# Patient Record
Sex: Female | Born: 1965 | Race: White | Hispanic: No | Marital: Married | Smoking: Never smoker
Health system: Southern US, Community
[De-identification: ages and names within clinical notes are randomized; demographics above are authoritative.]

## PROBLEM LIST (undated history)

## (undated) DIAGNOSIS — B029 Zoster without complications: Secondary | ICD-10-CM

## (undated) DIAGNOSIS — S12200A Unspecified displaced fracture of third cervical vertebra, initial encounter for closed fracture: Secondary | ICD-10-CM

## (undated) DIAGNOSIS — G35 Multiple sclerosis: Secondary | ICD-10-CM

## (undated) DIAGNOSIS — E7409 Other glycogen storage disease: Secondary | ICD-10-CM

## (undated) DIAGNOSIS — C801 Malignant (primary) neoplasm, unspecified: Secondary | ICD-10-CM

## (undated) HISTORY — PX: NOSE SURGERY: SHX723

## (undated) HISTORY — PX: BREAST SURGERY: SHX581

---

## 2018-11-28 ENCOUNTER — Other Ambulatory Visit: Payer: Self-pay

## 2018-11-28 DIAGNOSIS — Z20822 Contact with and (suspected) exposure to covid-19: Secondary | ICD-10-CM

## 2018-12-02 LAB — NOVEL CORONAVIRUS, NAA: SARS-CoV-2, NAA: NOT DETECTED

## 2018-12-02 LAB — SPECIMEN STATUS REPORT

## 2019-01-19 ENCOUNTER — Emergency Department (HOSPITAL_COMMUNITY)
Admission: EM | Admit: 2019-01-19 | Discharge: 2019-01-19 | Disposition: A | Discharge status: Home / Self Care | Payer: Medicaid Other | Attending: Emergency Medicine | Admitting: Emergency Medicine

## 2019-01-19 ENCOUNTER — Emergency Department (HOSPITAL_COMMUNITY): Payer: Medicaid Other

## 2019-01-19 ENCOUNTER — Encounter (HOSPITAL_COMMUNITY): Payer: Self-pay | Admitting: Emergency Medicine

## 2019-01-19 DIAGNOSIS — Z79899 Other long term (current) drug therapy: Secondary | ICD-10-CM | POA: Insufficient documentation

## 2019-01-19 DIAGNOSIS — R531 Weakness: Secondary | ICD-10-CM | POA: Diagnosis present

## 2019-01-19 DIAGNOSIS — Z859 Personal history of malignant neoplasm, unspecified: Secondary | ICD-10-CM | POA: Diagnosis not present

## 2019-01-19 DIAGNOSIS — G35 Multiple sclerosis: Secondary | ICD-10-CM | POA: Diagnosis not present

## 2019-01-19 HISTORY — DX: Other glycogen storage disease: E74.09

## 2019-01-19 HISTORY — DX: Unspecified displaced fracture of third cervical vertebra, initial encounter for closed fracture: S12.200A

## 2019-01-19 HISTORY — DX: Malignant (primary) neoplasm, unspecified: C80.1

## 2019-01-19 HISTORY — DX: Multiple sclerosis: G35

## 2019-01-19 HISTORY — DX: Zoster without complications: B02.9

## 2019-01-19 LAB — URINALYSIS, ROUTINE W REFLEX MICROSCOPIC
Bacteria, UA: NONE SEEN
Bilirubin Urine: NEGATIVE
Glucose, UA: NEGATIVE mg/dL
Ketones, ur: 5 mg/dL — AB
Leukocytes,Ua: NEGATIVE
Nitrite: NEGATIVE
Protein, ur: NEGATIVE mg/dL
Specific Gravity, Urine: 1.014 (ref 1.005–1.030)
pH: 6 (ref 5.0–8.0)

## 2019-01-19 LAB — CBC
HCT: 39.3 % (ref 36.0–46.0)
Hemoglobin: 13.4 g/dL (ref 12.0–15.0)
MCH: 32 pg (ref 26.0–34.0)
MCHC: 34.1 g/dL (ref 30.0–36.0)
MCV: 93.8 fL (ref 80.0–100.0)
Platelets: 215 10*3/uL (ref 150–400)
RBC: 4.19 MIL/uL (ref 3.87–5.11)
RDW: 14.9 % (ref 11.5–15.5)
WBC: 9.2 10*3/uL (ref 4.0–10.5)
nRBC: 0 % (ref 0.0–0.2)

## 2019-01-19 LAB — BASIC METABOLIC PANEL
Anion gap: 11 (ref 5–15)
BUN: 8 mg/dL (ref 6–20)
CO2: 23 mmol/L (ref 22–32)
Calcium: 10.3 mg/dL (ref 8.9–10.3)
Chloride: 105 mmol/L (ref 98–111)
Creatinine, Ser: 0.7 mg/dL (ref 0.44–1.00)
GFR calc Af Amer: >60 mL/min (ref >60)
GFR calc non Af Amer: >60 mL/min (ref >60)
Glucose, Bld: 101 mg/dL — ABNORMAL HIGH (ref 70–99)
Potassium: 3.8 mmol/L (ref 3.5–5.1)
Sodium: 139 mmol/L (ref 135–145)

## 2019-01-19 MED ORDER — SODIUM CHLORIDE 0.9 % IV BOLUS
1000.0000 mL | Freq: Once | INTRAVENOUS | Status: AC
  Administered 2019-01-19: 1000 mL via INTRAVENOUS

## 2019-01-19 MED ORDER — GADOBUTROL 1 MMOL/ML IV SOLN
7.0000 mL | Freq: Once | INTRAVENOUS | Status: AC | PRN
  Administered 2019-01-19: 16:00:00 7 mL via INTRAVENOUS

## 2019-01-19 MED ORDER — SODIUM CHLORIDE 0.9 % IV SOLN: 1000.0000 mg | Freq: Once | INTRAVENOUS | Status: DC

## 2019-01-19 MED ORDER — SODIUM CHLORIDE 0.9 % IV BOLUS
1000.0000 mL | Freq: Once | INTRAVENOUS | Status: AC
  Administered 2019-01-19: 14:00:00 1000 mL via INTRAVENOUS

## 2019-01-19 NOTE — ED Triage Notes (Signed)
Patient presents ambulatory stating she thinks she is having a MS exacerbation. States she woke up after having an episode of incontinence of her bowel and bladder. Patient also feeling weak.

## 2019-01-19 NOTE — ED Provider Notes (Signed)
[facility] EMERGENCY DEPARTMENT Provider Note   CSN: [id number] Arrival date & time: 01/19/19  1102     History   Chief Complaint Chief Complaint  Patient presents with  . Weakness    HPI Donna Clark is a 53 y.o. female.     Pt presents to the ED today with a possible MS flare.  The pt has a hx of MS and woke up this morning with bowel and bladder incontinence.  She also feels generally weak.  She does not take anything currently for her MS.     Past Medical History:  Diagnosis Date  . 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 C3 cervical fracture ([place])   . Cancer ([place])   . MS (multiple sclerosis) ([place])   . Shingles     There are no active problems to display for this patient.   Past Surgical History:  Procedure Laterality Date  . BREAST SURGERY     bilateral mastectomy   . NOSE SURGERY       OB History   No obstetric history on file.      Home Medications    Prior to Admission medications   Medication Sig Start Date End Date Taking? Authorizing Provider  pregabalin (LYRICA) 300 MG capsule Take 300 mg by mouth 3 (three) times daily.   Yes [provider]    Family History No family history on file.  Social History Social History   Tobacco Use  . Smoking status: Never Smoker  . Smokeless tobacco: Never Used  Substance Use Topics  . Alcohol use: Not Currently  . Drug use: Yes    Types: Marijuana     Allergies   Linzess [linaclotide] and Morphine and related   Review of Systems Review of Systems  Neurological:       Bowel and bladder incontinence.  All other systems reviewed and are negative.    Physical Exam Updated Vital Signs BP 126/76 (BP Location: Left Arm)   Pulse 68   Temp 99.2 F (37.3 C) (Oral)   Resp 16   SpO2 99%   Physical Exam Vitals signs and nursing note reviewed.  Constitutional:      Appearance: Normal appearance.  HENT:     Head: Normocephalic and  atraumatic.     Right Ear: External ear normal.     Left Ear: External ear normal.     Nose: Nose normal.     Mouth/Throat:     Mouth: Mucous membranes are moist.     Pharynx: Oropharynx is clear.  Eyes:     Extraocular Movements: Extraocular movements intact.     Conjunctiva/sclera: Conjunctivae normal.     Pupils: Pupils are equal, round, and reactive to light.  Neck:     Musculoskeletal: Normal range of motion and neck supple.  Cardiovascular:     Rate and Rhythm: Normal rate and regular rhythm.     Pulses: Normal pulses.     Heart sounds: Normal heart sounds.  Pulmonary:     Effort: Pulmonary effort is normal.     Breath sounds: Normal breath sounds.  Abdominal:     General: Abdomen is flat. Bowel sounds are normal.     Palpations: Abdomen is soft.  Musculoskeletal: Normal range of motion.  Skin:    General: Skin is warm.     Capillary Refill: Capillary refill takes less than 2 seconds.  Neurological:     General: No focal deficit present.     Mental  Status: She is alert and oriented to person, place, and time.  Psychiatric:        Mood and Affect: Mood normal.        Behavior: Behavior normal.        Thought Content: Thought content normal.        Judgment: Judgment normal.      ED Treatments / Results  Labs (all labs ordered are listed, but only abnormal results are displayed) Labs Reviewed  BASIC METABOLIC PANEL - Abnormal; Notable for the following components:      Result Value   Glucose, Bld 101 (*)    All other components within normal limits  URINALYSIS, ROUTINE W REFLEX MICROSCOPIC - Abnormal; Notable for the following components:   Hgb urine dipstick SMALL (*)    Ketones, ur 5 (*)    All other components within normal limits  CBC    EKG None  Radiology No results found.  Procedures Procedures (including critical care time)  Medications Ordered in ED Medications  sodium chloride 0.9 % bolus 1,000 mL (has no administration in time range)   sodium chloride 0.9 % bolus 1,000 mL (1,000 mLs Intravenous New Bag/Given 01/19/19 1414)     Initial Impression / Assessment and Plan / ED Course  I have reviewed the triage vital signs and the nursing notes.  Pertinent labs & imaging results that were available during my care of the patient were reviewed by me and considered in my medical decision making (see chart for details).       Pt does not have a local neurologist.  She was d/w Dr. Rory Percy who recommended MRI brain and cervical spine.  If there are lesions, then high dose solumedrol.  If not, she can go home.  MRI is still pending at shift change.  Pt signed out to Dr. Wilson Singer.  Final Clinical Impressions(s) / ED Diagnoses   Final diagnoses:  MS (multiple sclerosis) [facility])    ED Discharge Orders    None       Isla Pence, MD 01/19/19 1516

## 2019-01-19 NOTE — ED Notes (Signed)
Pt ambulatory to the bathroom 

## 2019-01-19 NOTE — ED Provider Notes (Signed)
Assumed care in sign out. Imaging as below. Hx of MS but imaging w/o evidence of acute demyelination. Some cervical spine disc protrusion but shouldn't account for her symptoms. Outpt follow-up.   Mr Jeri Cos And Wo Contrast  Result Date: 01/19/2019 CLINICAL DATA:  53 year old female with multiple sclerosis, increased weakness, incontinence. EXAM: MRI HEAD WITHOUT AND WITH CONTRAST TECHNIQUE: Multiplanar, multiecho pulse sequences of the brain and surrounding structures were obtained without and with intravenous contrast. CONTRAST:  102mL GADAVIST GADOBUTROL 1 MMOL/ML IV SOLN COMPARISON:  None. FINDINGS: Brain: Cerebral volume is within normal limits for age. No restricted diffusion to suggest acute infarction. No midline shift, mass effect, evidence of mass lesion, ventriculomegaly, extra-axial collection or acute intracranial hemorrhage. Cervicomedullary junction and pituitary are within normal limits. A few small scattered T2 and FLAIR hyperintense foci are noted in the cerebral white matter, including at the left frontal operculum series 9, image 16. There are mild signal changes in the periventricular white matter abutting the corpus callosum. No temporal lobe involvement. No cortical involvement or encephalomalacia. Deep gray matter nuclei, brainstem and cerebellum are spared. No chronic blood products. No abnormal enhancement identified. No dural thickening. Vascular: Major intracranial vascular flow voids are preserved. The major dural venous sinuses are enhancing and appear to be patent. Skull and upper cervical spine: Cervical spine reported separately today. Visualized bone marrow signal is within normal limits. Sinuses/Orbits: Orbits appear normal.  Paranasal sinuses are clear. Other: Mastoids are clear. Visible internal auditory structures appear normal. Scalp and face soft tissues appear negative. IMPRESSION: 1. Mild for age and nonspecific cerebral white matter signal changes. These could be related  to chronic demyelinating disease. 2. No evidence of acute demyelination or acute intracranial abnormality. Electronically Signed   By: Genevie Ann M.D.   On: 01/19/2019 16:39   Mr Cervical Spine W Or Wo Contrast  Result Date: 01/19/2019 CLINICAL DATA:  53 year old female with multiple sclerosis, increased weakness, incontinence. EXAM: MRI CERVICAL SPINE WITHOUT AND WITH CONTRAST TECHNIQUE: Multiplanar and multiecho pulse sequences of the cervical spine, to include the craniocervical junction and cervicothoracic junction, were obtained without and with intravenous contrast. CONTRAST:  24mL GADAVIST GADOBUTROL 1 MMOL/ML IV SOLN in conjunction with contrast enhanced imaging of the brain reported separately. COMPARISON:  Brain MRI today reported separately. FINDINGS: Alignment: Straightening of cervical lordosis. No spondylolisthesis. Vertebrae: Mild hardware susceptibility artifact at C5-C6 related to prior ACDF. No marrow edema or evidence of acute osseous abnormality. Background bone marrow signal appears normal. Cord: No definite abnormal cervical spinal cord signal. Visible upper thoracic spinal cord appears within normal limits. No abnormal intradural enhancement. No dural thickening. Posterior Fossa, vertebral arteries, paraspinal tissues: Cervicomedullary junction is within normal limits. Brain findings are reported separately. Preserved major vascular flow voids in the neck. Negative visible neck soft tissues and lung apices. Disc levels: C2-C3:  Mild facet hypertrophy. C3-C4: Mild rightward disc bulge and endplate spurring. Neural foramina are most affected. Mild facet hypertrophy. Mild right greater than left C4 foraminal stenosis. C4-C5: Small to moderate size central to left paracentral disc protrusion (series 12, image 21 and series 9 image 8) superimposed on mild disc bulging. Mild spinal stenosis and spinal cord mass effect. No significant foraminal stenosis. C5-C6: Prior ACDF. Residual endplate spurring  with mild to moderate bilateral C6 foraminal stenosis. C6-C7: Circumferential disc bulge and endplate spurring. Mild broad-based posterior component without spinal stenosis. Mild to moderate left and borderline to mild right C7 foraminal stenosis. C7-T1: Mild disc bulge and endplate  spurring most affecting the neural foramina. Mild facet hypertrophy greater on the left. Mild to moderate left C8 foraminal stenosis. No upper thoracic spinal stenosis. IMPRESSION: 1. 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 No associated cord signal abnormality. 2. No convincing demyelinating disease in the visible spinal cord. 3. Other cervical spine degeneration without spinal stenosis. Up to moderate neural foraminal stenosis at the bilateral C6, left C7 and left C8 nerve levels. Electronically Signed   By: Genevie Ann M.D.   On: 01/19/2019 16:44      Virgel Manifold, MD 01/19/19 1706

## 2019-01-19 NOTE — Discharge Instructions (Addendum)
Your MRIs did not show evidence of acute demyelination. Your symptoms do not appear to be from a MS exacerbation. There is evidence of prior neck surgery and some intervertebral disc protrusion but this wouldn't account for your symptoms. If your symptoms persist, then you need to follow-up with neurology.

## 2019-01-19 NOTE — ED Notes (Signed)
Patient verbalizes understanding of discharge instructions. Opportunity for questioning and answers were provided. Armband removed by staff, pt discharged from ED ambulatory to home.  

## 2020-08-08 IMAGING — MR MR HEAD WO/W CM
15 of 17 series · 40 of 48 positions shown · IV contrast (gadavist)
Comparison: None.

CLINICAL DATA: 52-year-old female with multiple sclerosis,
increased weakness, incontinence.

EXAM:
MRI HEAD WITHOUT AND WITH CONTRAST
TECHNIQUE: Multiplanar, multiecho pulse sequences of the brain and surrounding
structures were obtained without and with intravenous contrast.
CONTRAST:  7mL GADAVIST GADOBUTROL 1 MMOL/ML IV SOLN

[Series 5: DWI · axial · 3.0mm · 0.88mm/px · z∈[-65,+88]mm · 6 of 104 slices shown (1 of 4)]
[im 1/104]
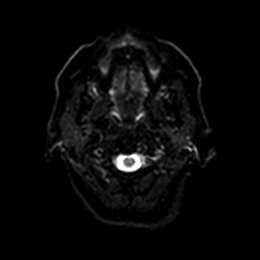
[im 21/104]
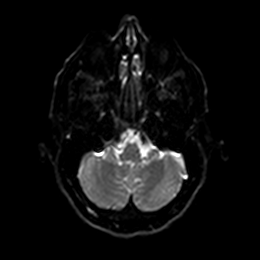
[im 42/104]
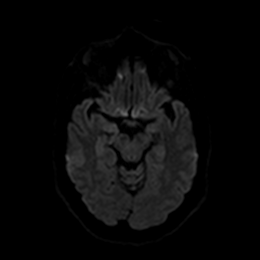
[im 62/104]
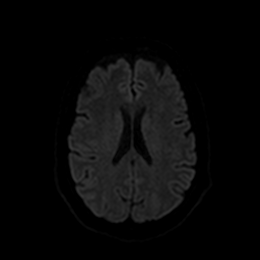
[im 83/104]
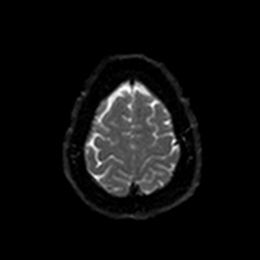
[im 104/104]
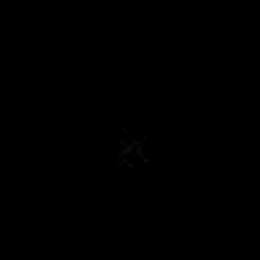

[Series 6: DWI · axial · 3.0mm · 0.88mm/px · z∈[-65,+88]mm · 3 of 52 slices shown (2 of 4)]
[im 1/52]
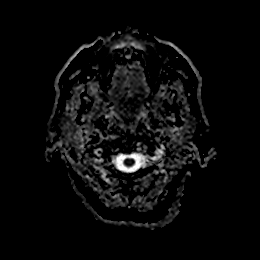
[im 26/52]
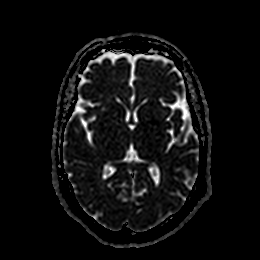
[im 52/52]
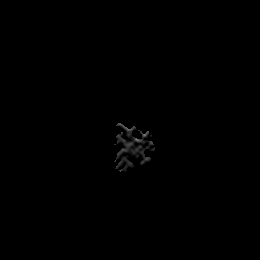

[Series 7: DWI · coronal · 4.0mm · 0.88mm/px · 4 of 74 slices shown (3 of 4)]
[im 1/74]
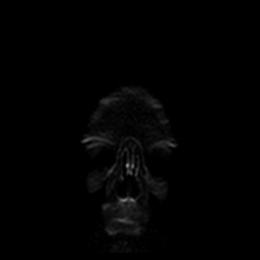
[im 25/74]
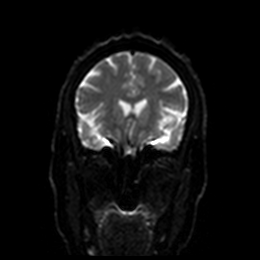
[im 49/74]
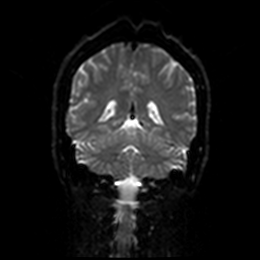
[im 74/74]
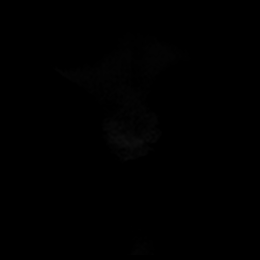

[Series 8: DWI · coronal · 4.0mm · 0.88mm/px · 2 of 37 slices shown (4 of 4)]
[im 1/37]
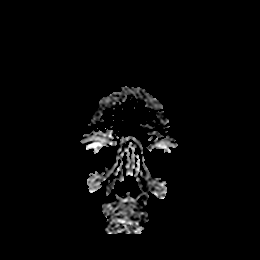
[im 37/37]
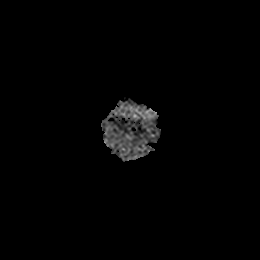

[Series 9: FLAIR · axial · 5.0mm · 0.45mm/px · 1 of 25 slices shown]
[im 1/25]
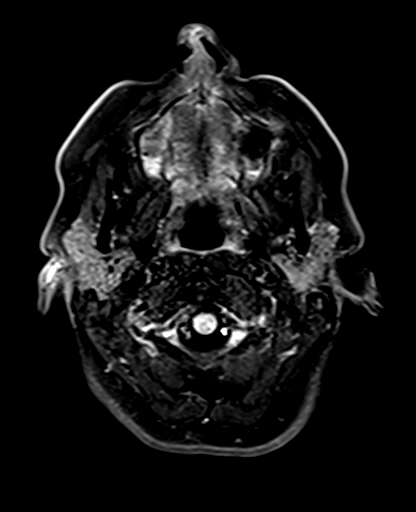

[Series 10: mag_images · axial · 3.0mm · 0.90mm/px · z∈[-64,+88]mm · 3 of 52 slices shown]
[im 1/52]
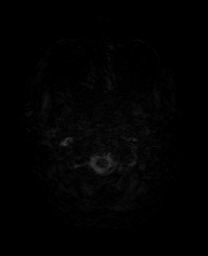
[im 26/52]
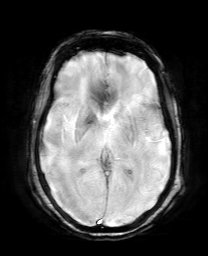
[im 52/52]
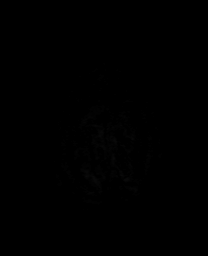

[Series 11: pha_images · axial · 3.0mm · 0.90mm/px · z∈[-58,+88]mm · 3 of 50 slices shown]
[im 1/50]
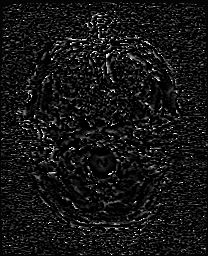
[im 25/50]
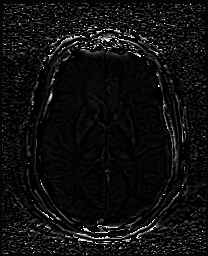
[im 50/50]
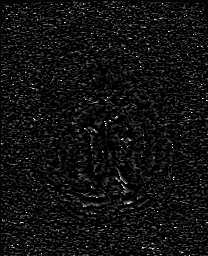

[Series 12: swi_images · axial · 3.0mm · 0.90mm/px · z∈[-64,+88]mm · 3 of 52 slices shown]
[im 1/52]
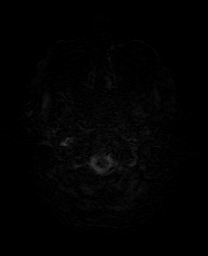
[im 26/52]
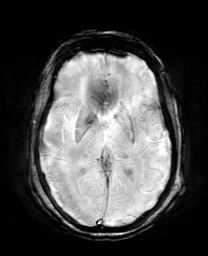
[im 52/52]
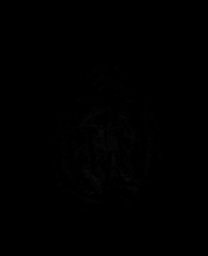

[Series 13: mip_images(sw) · axial · 24.0mm · 0.90mm/px · z∈[-54,+78]mm · 2 of 45 slices shown]
[im 1/45]
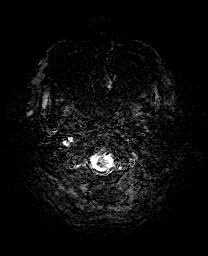
[im 45/45]
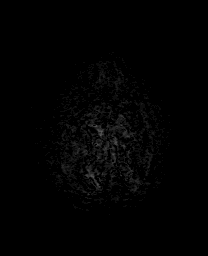

[Series 14: T1 · sagittal · 5.0mm · 0.75mm/px · 1 of 24 slices shown]
[im 1/24]
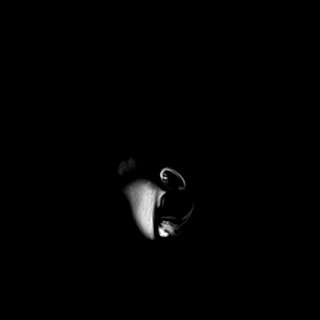

[Series 15: t2_space_dark-fluid_sag_p2_ns-ir · sagittal · 1.0mm · 0.49mm/px · 6 of 144 slices shown]
[im 1/144]
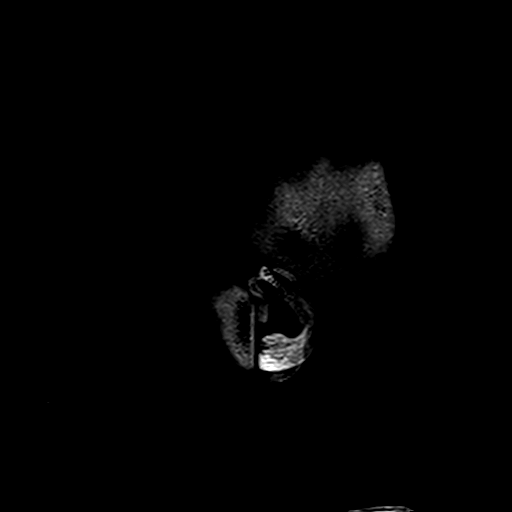
[im 21/144]
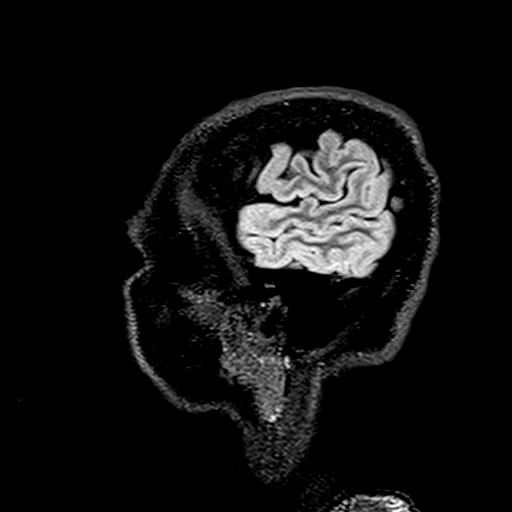
[im 41/144]
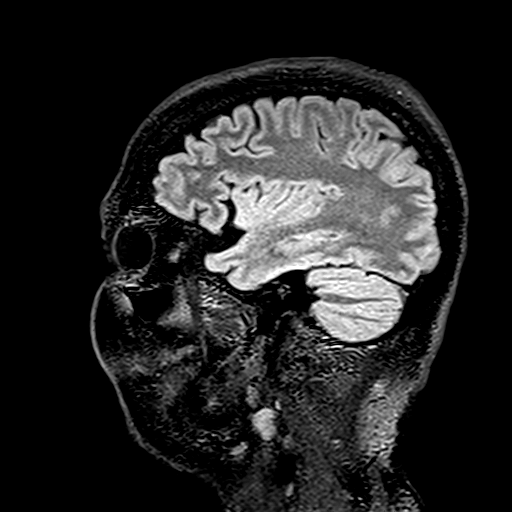
[im 62/144]
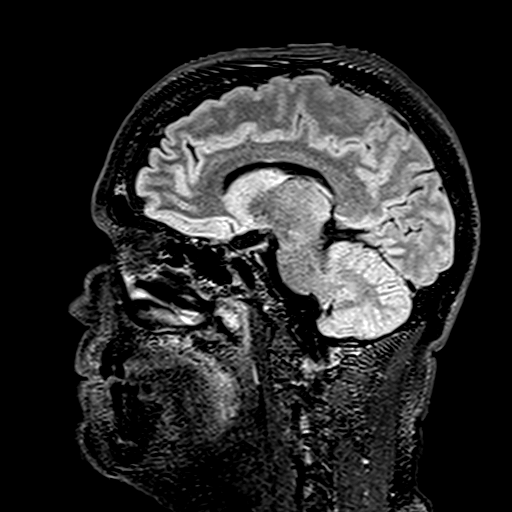
[im 82/144]
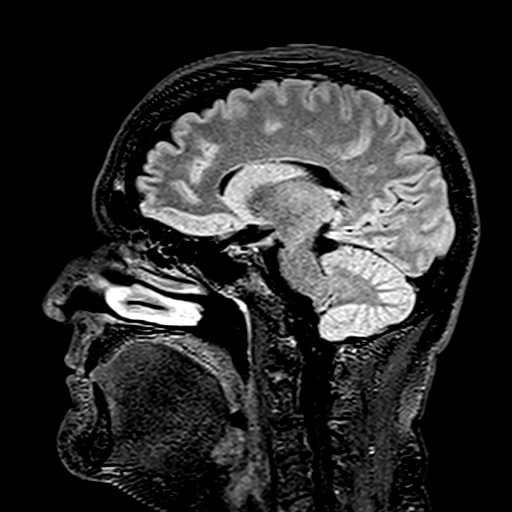
[im 103/144]
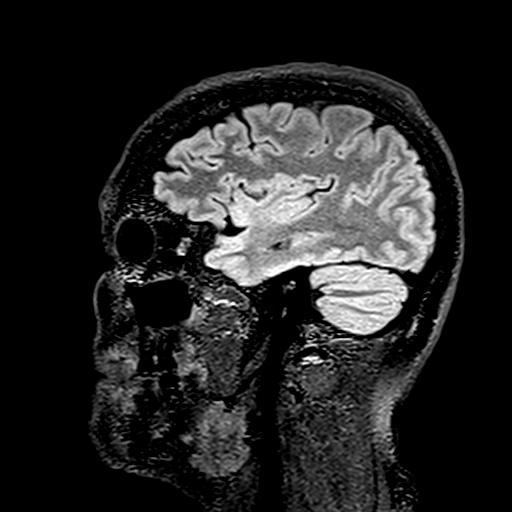

[Series 16: T2 · axial · 5.0mm · 0.72mm/px · 1 of 25 slices shown]
[im 1/25]
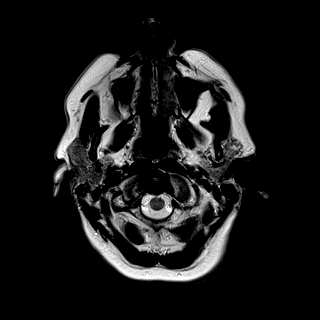

[Series 18: T2 post-contrast · coronal · 5.0mm · 0.72mm/px · 2 of 31 slices shown]
[im 1/31]
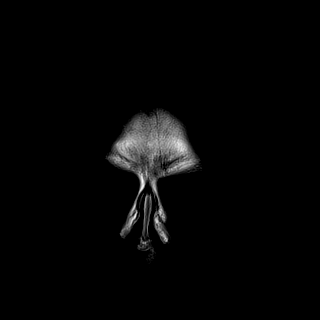
[im 31/31]
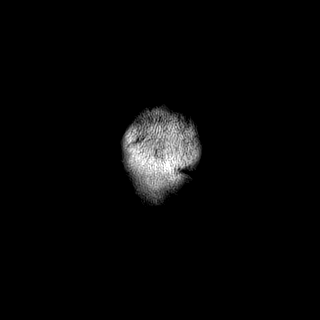

[Series 20: T1 post-contrast · coronal · 5.0mm · 0.34mm/px · 2 of 31 slices shown (1 of 2)]
[im 1/31]
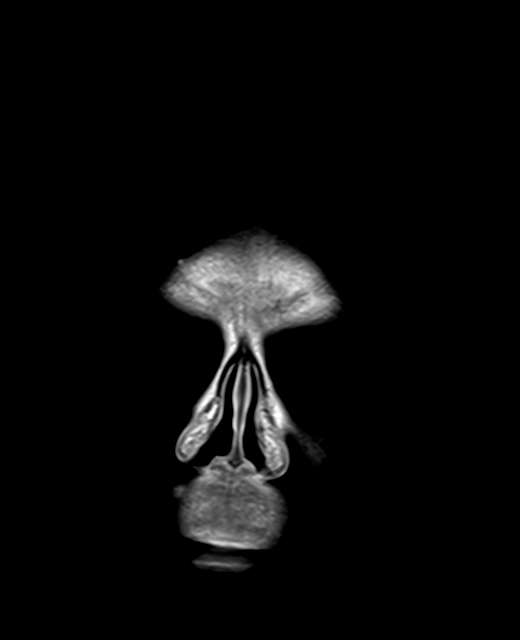
[im 31/31]
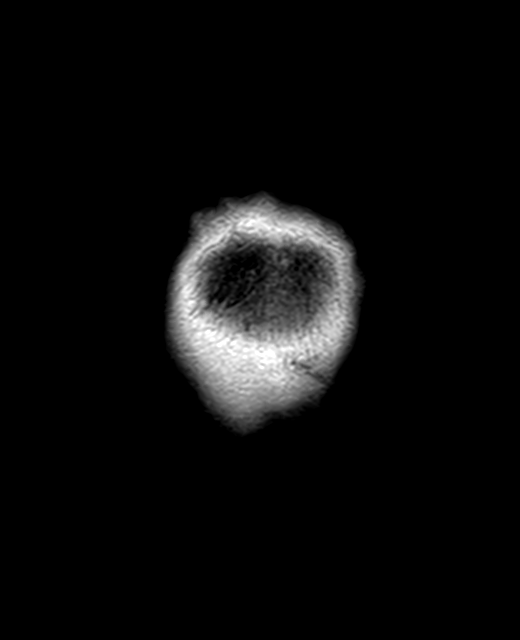

[Series 21: T1 post-contrast · sagittal · 5.0mm · 0.75mm/px · 1 of 25 slices shown (2 of 2)]
[im 1/25]
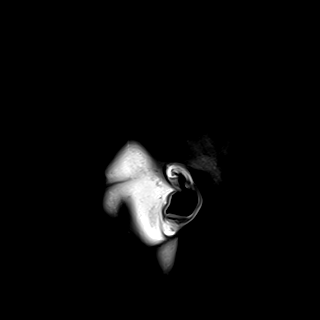

[40 of 48 positions shown; findings below may reference images not displayed]

FINDINGS: Brain: Cerebral volume is within normal limits for age. No
restricted diffusion to suggest acute infarction. No midline shift,
mass effect, evidence of mass lesion, ventriculomegaly, extra-axial
collection or acute intracranial hemorrhage. Cervicomedullary
junction and pituitary are within normal limits.

A few small scattered T2 and FLAIR hyperintense foci are noted in
the cerebral white matter, including at the left frontal operculum
series 9, image 16. There are mild signal changes in the
periventricular white matter abutting the corpus callosum. No
temporal lobe involvement. No cortical involvement or
encephalomalacia. Deep gray matter nuclei, brainstem and cerebellum
are spared.

No chronic blood products. No abnormal enhancement identified. No
dural thickening.

Vascular: Major intracranial vascular flow voids are preserved. The
major dural venous sinuses are enhancing and appear to be patent.

Skull and upper cervical spine: Cervical spine reported separately
today. Visualized bone marrow signal is within normal limits.

Sinuses/Orbits: Orbits appear normal.  Paranasal sinuses are clear.

Other: Mastoids are clear. Visible internal auditory structures
appear normal. Scalp and face soft tissues appear negative.
IMPRESSION: 1. Mild for age and nonspecific cerebral white matter signal
changes. These could be related to chronic demyelinating disease.
2. No evidence of acute demyelination or acute intracranial
abnormality.
